# Patient Record
Sex: Female | Born: 1975 | Race: White | Hispanic: No | Marital: Married
Health system: Southern US, Community
[De-identification: ages and names within clinical notes are randomized; demographics above are authoritative.]

## PROBLEM LIST (undated history)

## (undated) HISTORY — PX: AUGMENTATION MAMMAPLASTY: SUR837

---

## 2017-10-27 DIAGNOSIS — J069 Acute upper respiratory infection, unspecified: Secondary | ICD-10-CM | POA: Diagnosis not present

## 2017-10-27 DIAGNOSIS — R05 Cough: Secondary | ICD-10-CM | POA: Diagnosis not present

## 2018-08-09 ENCOUNTER — Ambulatory Visit: Payer: Self-pay | Admitting: Family Medicine

## 2018-09-20 ENCOUNTER — Ambulatory Visit: Payer: Self-pay | Admitting: Family Medicine

## 2018-11-25 ENCOUNTER — Telehealth: Payer: Self-pay

## 2018-11-25 DIAGNOSIS — J029 Acute pharyngitis, unspecified: Secondary | ICD-10-CM | POA: Diagnosis not present

## 2018-11-25 NOTE — Telephone Encounter (Signed)
Called pt to inform of NP policy. Pt stated the first visit she cancelled due to the virus. Because that was when it all started.

## 2018-11-25 NOTE — Telephone Encounter (Signed)
Breanna Willis according to [employer], you can ask other [facility] providers if they would be willing to accept the patient as a new patient. Dr. Rogers Blocker is out until August so she is not available to ask immediately however, include her with the providers.   Until you hear back from the providers the patient may need to go to urgent care for their acute need as you need provider approval before rescheduling.   For clarification, the patient only no showed once and cancelled days before her the 1st appointment.

## 2018-11-25 NOTE — Telephone Encounter (Signed)
Hey, unsure what to do with pt. Pt cancelled twice for NP with Rogers Blocker. No availability here for acute. Will she need to establish at another office ?  Copied from [place] [phone]. Topic: Appointment Scheduling - Scheduling Inquiry for Clinic >> Nov 25, 2018  9:12 AM Alanda Slim E wrote: Reason for CRM: Pt needs to reschedule her New patient appt with Dr. Rogers Blocker and aslso wants to be seen because she has strep throat / please advise

## 2019-09-02 ENCOUNTER — Ambulatory Visit: Payer: BC Managed Care – PPO | Attending: Internal Medicine

## 2019-09-02 DIAGNOSIS — Z23 Encounter for immunization: Secondary | ICD-10-CM

## 2019-09-02 NOTE — Progress Notes (Signed)
   Covid-19 Vaccination Clinic  Name:  DENEEN SLAGER    MRN: [id number] DOB: 1976/04/06  09/02/2019  Ms. Maul was observed post Covid-19 immunization for 15 minutes without incident. She was provided with Vaccine Information Sheet and instruction to access the V-Safe system.   Ms. Full was instructed to call 911 with any severe reactions post vaccine: Marland Kitchen Difficulty breathing  . Swelling of face and throat  . A fast heartbeat  . A bad rash all over body  . Dizziness and weakness   Immunizations Administered    Name Date Dose VIS Date Route   Pfizer COVID-19 Vaccine 09/02/2019  8:16 AM 0.3 mL 05/06/2019 Intramuscular   Manufacturer: [employer], [employer]   Lot: [id number]   NDC: [address]-5

## 2019-10-03 ENCOUNTER — Ambulatory Visit: Payer: BC Managed Care – PPO | Attending: Internal Medicine

## 2019-10-03 DIAGNOSIS — Z0000 Encounter for general adult medical examination without abnormal findings: Secondary | ICD-10-CM | POA: Diagnosis not present

## 2019-10-03 DIAGNOSIS — Z13220 Encounter for screening for lipoid disorders: Secondary | ICD-10-CM | POA: Diagnosis not present

## 2019-10-03 DIAGNOSIS — Z23 Encounter for immunization: Secondary | ICD-10-CM

## 2019-10-03 NOTE — Progress Notes (Signed)
   Covid-19 Vaccination Clinic  Name:  ANEESHA HOLLORAN    MRN: [id number] DOB: 1975-11-13  10/03/2019  Ms. Kildow was observed post Covid-19 immunization for 15 minutes without incident. She was provided with Vaccine Information Sheet and instruction to access the V-Safe system.   Ms. Gagen was instructed to call 911 with any severe reactions post vaccine: Marland Kitchen Difficulty breathing  . Swelling of face and throat  . A fast heartbeat  . A bad rash all over body  . Dizziness and weakness   Immunizations Administered    Name Date Dose VIS Date Route   Pfizer COVID-19 Vaccine 10/03/2019  8:13 AM 0.3 mL 07/20/2018 Intramuscular   Manufacturer: [employer], [employer]   Lot: [id number]   NDC: [address]-3

## 2019-10-06 ENCOUNTER — Other Ambulatory Visit: Payer: Self-pay | Admitting: Family Medicine

## 2019-10-06 DIAGNOSIS — Z1231 Encounter for screening mammogram for malignant neoplasm of breast: Secondary | ICD-10-CM

## 2019-10-26 DIAGNOSIS — B349 Viral infection, unspecified: Secondary | ICD-10-CM | POA: Diagnosis not present

## 2019-10-26 DIAGNOSIS — H1013 Acute atopic conjunctivitis, bilateral: Secondary | ICD-10-CM | POA: Diagnosis not present

## 2020-06-25 DIAGNOSIS — U071 COVID-19: Secondary | ICD-10-CM | POA: Diagnosis not present

## 2020-06-25 DIAGNOSIS — Z20822 Contact with and (suspected) exposure to covid-19: Secondary | ICD-10-CM | POA: Diagnosis not present

## 2020-09-03 ENCOUNTER — Ambulatory Visit
Admission: RE | Admit: 2020-09-03 | Discharge: 2020-09-03 | Disposition: A | Discharge status: Home / Self Care | Payer: BC Managed Care – PPO | Source: Ambulatory Visit | Attending: Family Medicine | Admitting: Family Medicine

## 2020-09-03 ENCOUNTER — Other Ambulatory Visit: Payer: Self-pay

## 2020-09-03 DIAGNOSIS — Z1231 Encounter for screening mammogram for malignant neoplasm of breast: Secondary | ICD-10-CM

## 2020-09-05 ENCOUNTER — Other Ambulatory Visit: Payer: Self-pay | Admitting: Family Medicine

## 2020-09-05 DIAGNOSIS — R928 Other abnormal and inconclusive findings on diagnostic imaging of breast: Secondary | ICD-10-CM

## 2020-09-23 HISTORY — PX: BREAST BIOPSY: SHX20

## 2020-10-01 ENCOUNTER — Other Ambulatory Visit: Payer: Self-pay

## 2020-10-01 ENCOUNTER — Other Ambulatory Visit: Payer: Self-pay | Admitting: Family Medicine

## 2020-10-01 ENCOUNTER — Ambulatory Visit: Payer: BC Managed Care – PPO

## 2020-10-01 ENCOUNTER — Ambulatory Visit
Admission: RE | Admit: 2020-10-01 | Discharge: 2020-10-01 | Disposition: A | Discharge status: Home / Self Care | Payer: BC Managed Care – PPO | Source: Ambulatory Visit | Attending: Family Medicine | Admitting: Family Medicine

## 2020-10-01 DIAGNOSIS — R922 Inconclusive mammogram: Secondary | ICD-10-CM | POA: Diagnosis not present

## 2020-10-01 DIAGNOSIS — R928 Other abnormal and inconclusive findings on diagnostic imaging of breast: Secondary | ICD-10-CM | POA: Diagnosis not present

## 2020-10-01 DIAGNOSIS — R921 Mammographic calcification found on diagnostic imaging of breast: Secondary | ICD-10-CM

## 2020-10-01 DIAGNOSIS — Z803 Family history of malignant neoplasm of breast: Secondary | ICD-10-CM | POA: Diagnosis not present

## 2020-10-04 ENCOUNTER — Ambulatory Visit
Admission: RE | Admit: 2020-10-04 | Discharge: 2020-10-04 | Disposition: A | Discharge status: Home / Self Care | Payer: BC Managed Care – PPO | Source: Ambulatory Visit | Attending: Family Medicine | Admitting: Family Medicine

## 2020-10-04 ENCOUNTER — Other Ambulatory Visit: Payer: Self-pay

## 2020-10-04 DIAGNOSIS — R921 Mammographic calcification found on diagnostic imaging of breast: Secondary | ICD-10-CM

## 2020-10-04 DIAGNOSIS — N6011 Diffuse cystic mastopathy of right breast: Secondary | ICD-10-CM | POA: Diagnosis not present

## 2021-08-05 ENCOUNTER — Other Ambulatory Visit: Payer: Self-pay | Admitting: Family Medicine

## 2021-08-05 DIAGNOSIS — Z1231 Encounter for screening mammogram for malignant neoplasm of breast: Secondary | ICD-10-CM

## 2021-08-16 DIAGNOSIS — Z13220 Encounter for screening for lipoid disorders: Secondary | ICD-10-CM | POA: Diagnosis not present

## 2021-08-16 DIAGNOSIS — Z23 Encounter for immunization: Secondary | ICD-10-CM | POA: Diagnosis not present

## 2021-09-09 ENCOUNTER — Ambulatory Visit
Admission: RE | Admit: 2021-09-09 | Discharge: 2021-09-09 | Disposition: A | Discharge status: Home / Self Care | Payer: BC Managed Care – PPO | Source: Ambulatory Visit | Attending: Family Medicine | Admitting: Family Medicine

## 2021-09-09 DIAGNOSIS — Z1231 Encounter for screening mammogram for malignant neoplasm of breast: Secondary | ICD-10-CM

## 2021-09-18 IMAGING — MG MM BREAST LOCALIZATION CLIP
4 series · 4 of 12 positions shown · non-contrast
Comparison: Previous exam(s).

CLINICAL DATA: Post procedure mammogram for clip placement.

EXAM:
DIAGNOSTIC RIGHT MAMMOGRAM POST STEREOTACTIC BIOPSY

[R CC synth-2D]
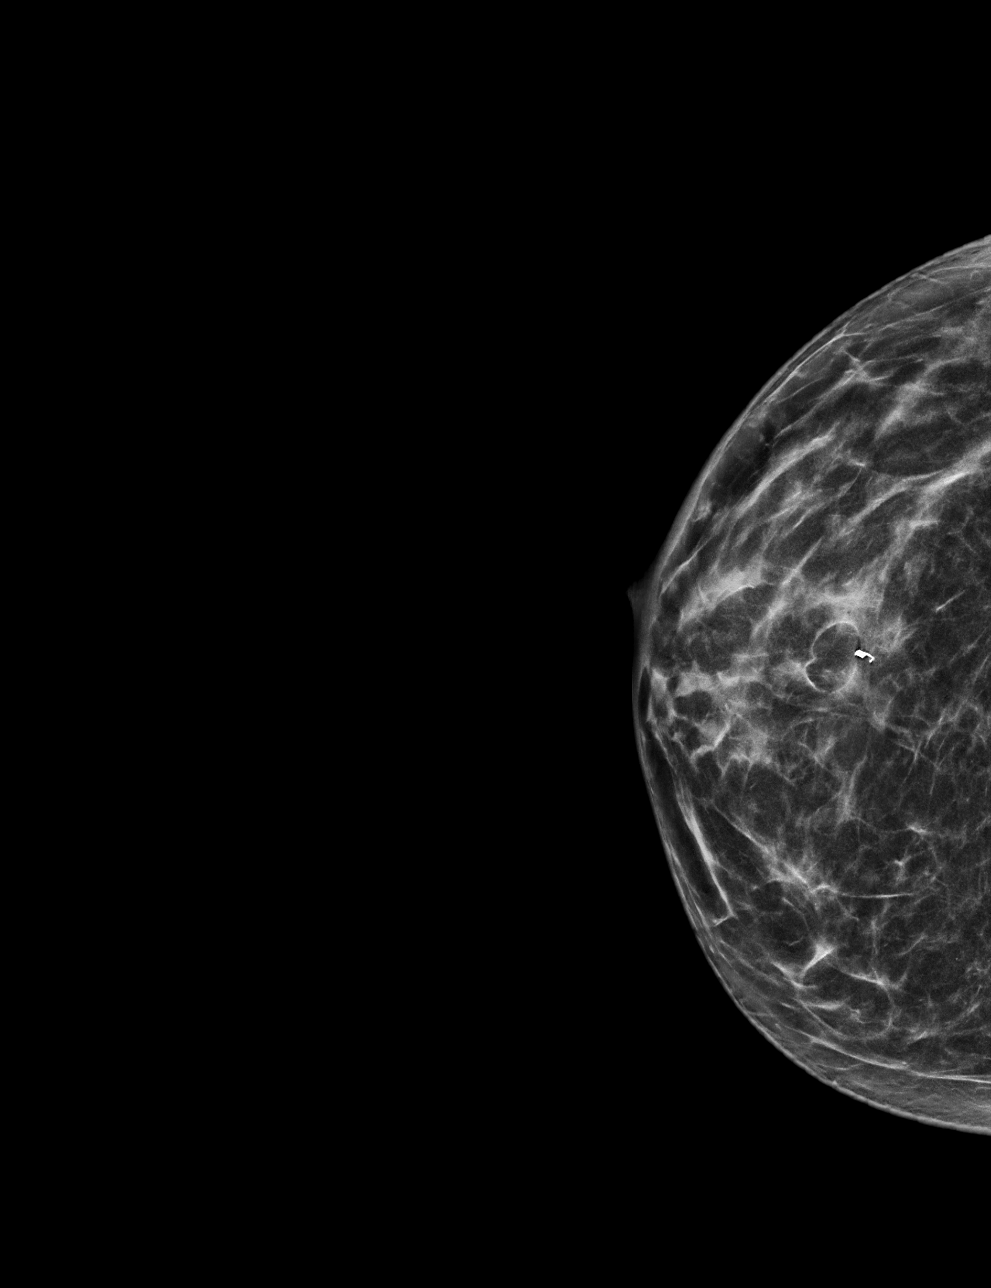

[R ML synth-2D]
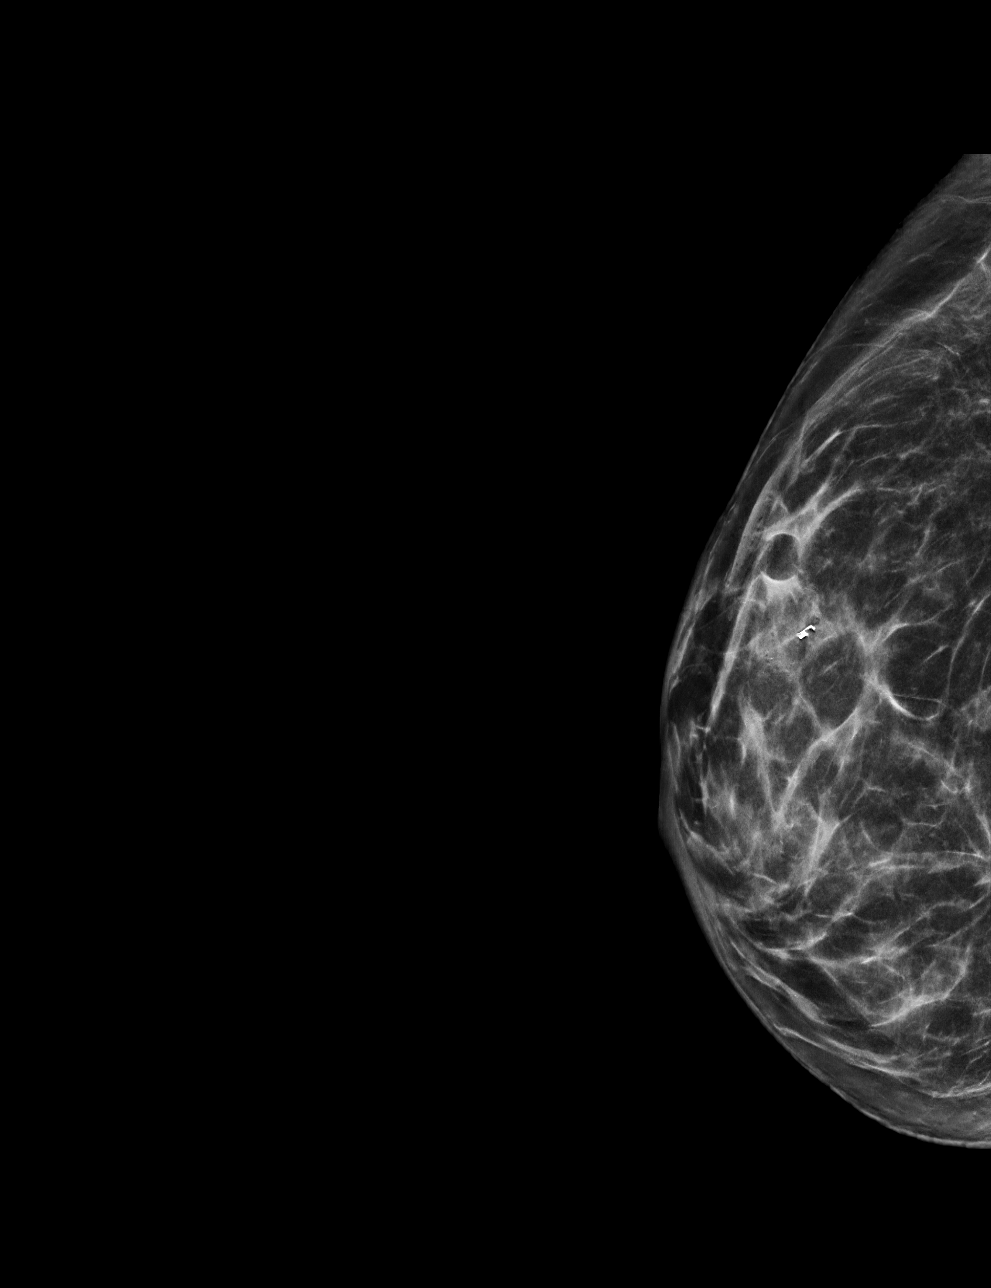

[R ML tomo · tomo slice 29/58.0]
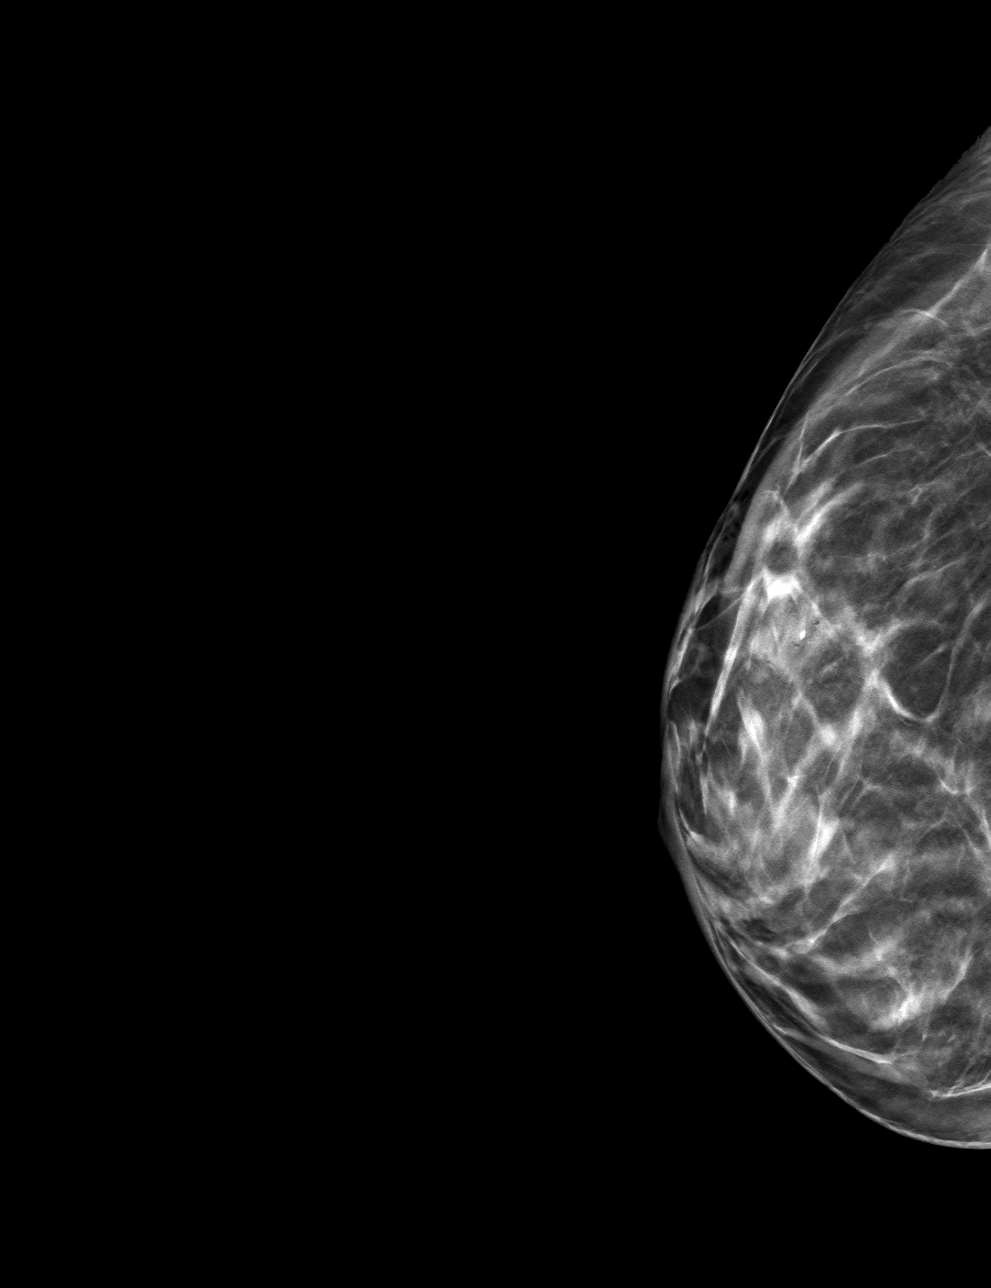

[R CC tomo · tomo slice 27/54.0]
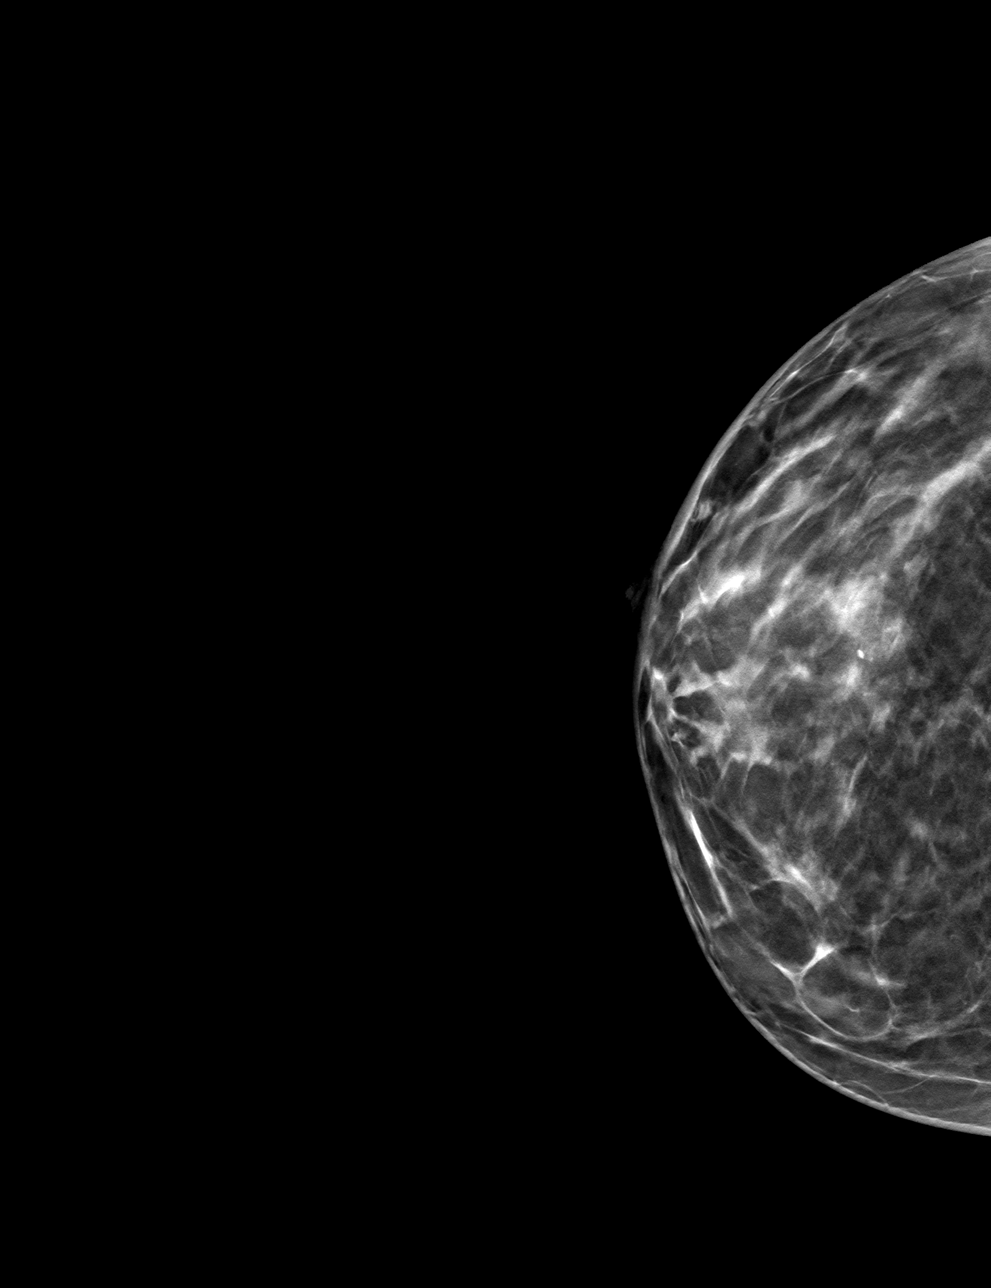

[4 of 12 positions shown; findings below may reference images not displayed]

FINDINGS: Mammographic images were obtained following stereotactic guided
biopsy of calcifications in the right breast at 12 o'clock. The coil
biopsy marking clip is in expected position at the site of biopsy.
IMPRESSION: Appropriate positioning of the coil shaped biopsy marking clip at
the site of biopsy in the right breast at 12 o'clock.

Final Assessment: Post Procedure Mammograms for Marker Placement

## 2021-09-18 IMAGING — MG MM BREAST BX W/ LOC DEV 1ST LESION IMAGE BX SPEC STEREO GUIDE*R*
8 of 11 series · 8 of 19 positions shown · non-contrast
Comparison: Previous exams.
COMPARISON: Previous exams.

Addendum:
CLINICAL DATA: 44-year-old female presenting for biopsy of right
breast calcifications.

EXAM:
RIGHT BREAST STEREOTACTIC CORE NEEDLE BIOPSY

[R (1 of 8)]
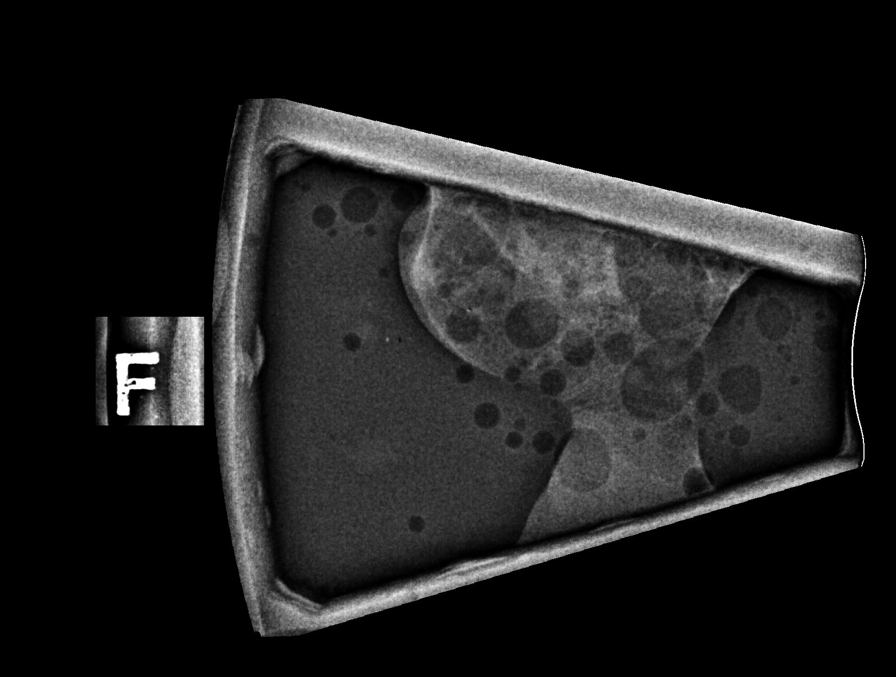

[R (2 of 8)]
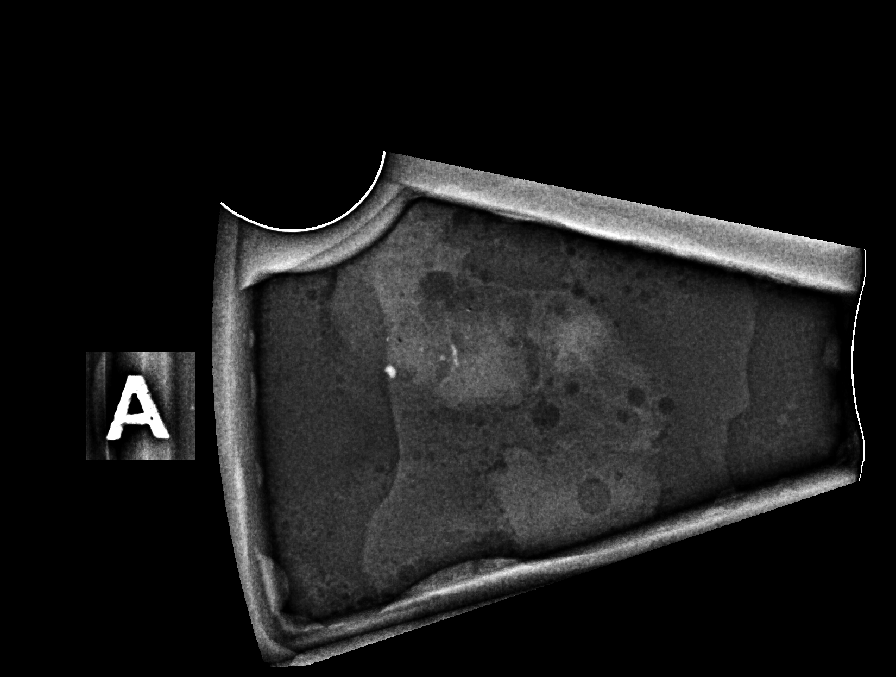

[R (3 of 8)]
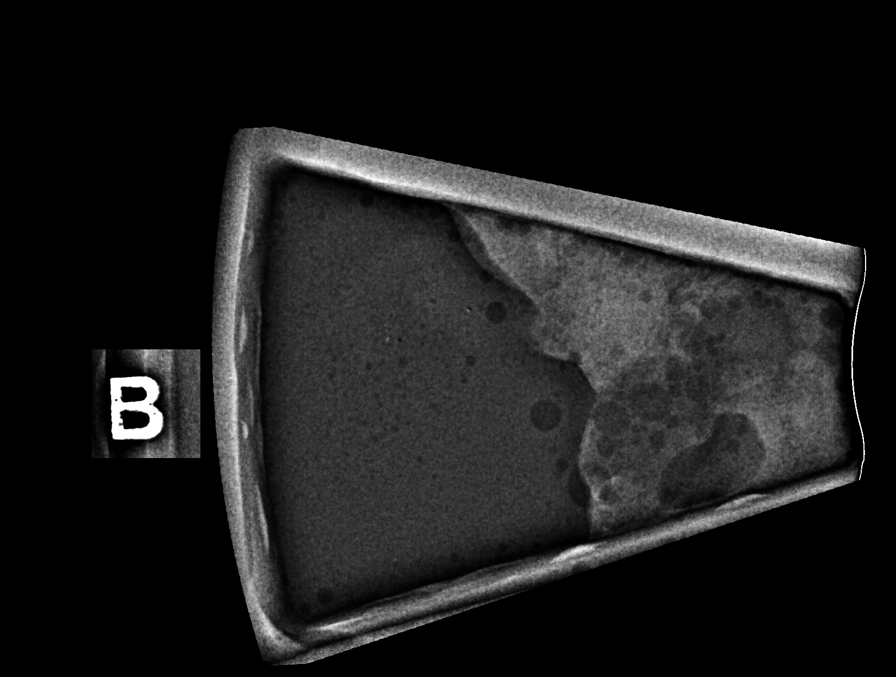

[R (4 of 8)]
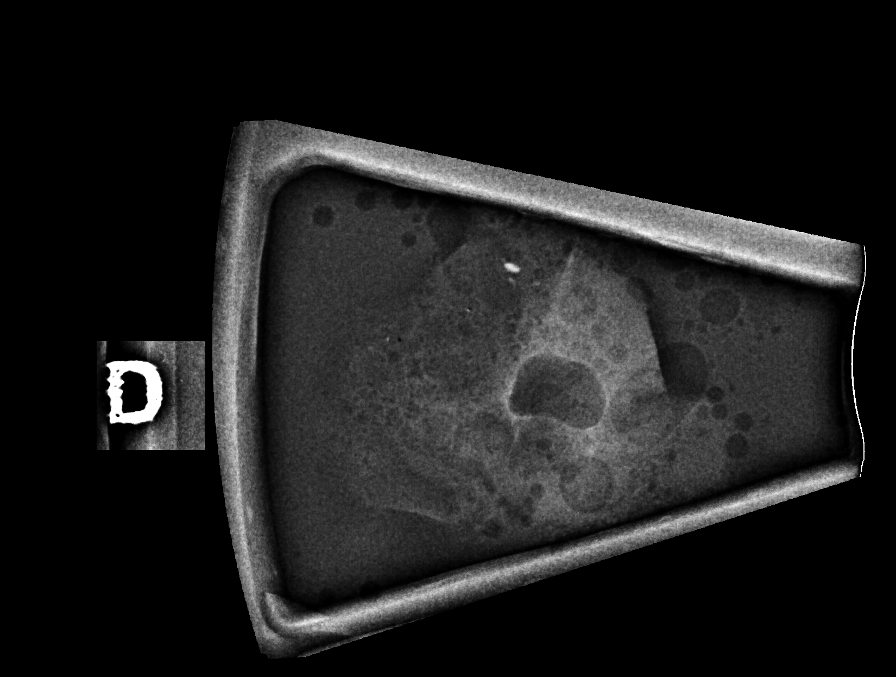

[R (5 of 8)]
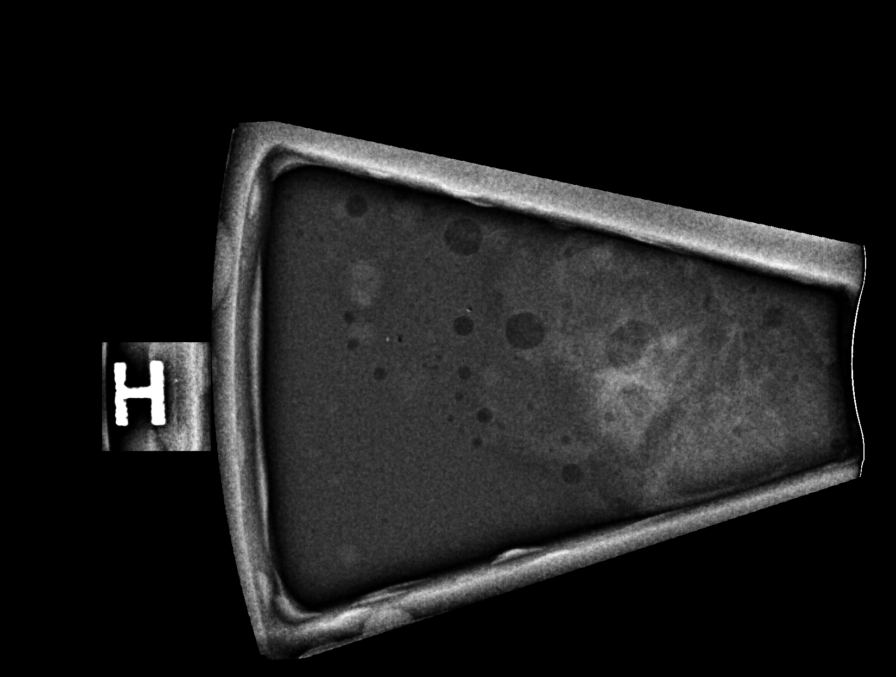

[R (6 of 8)]
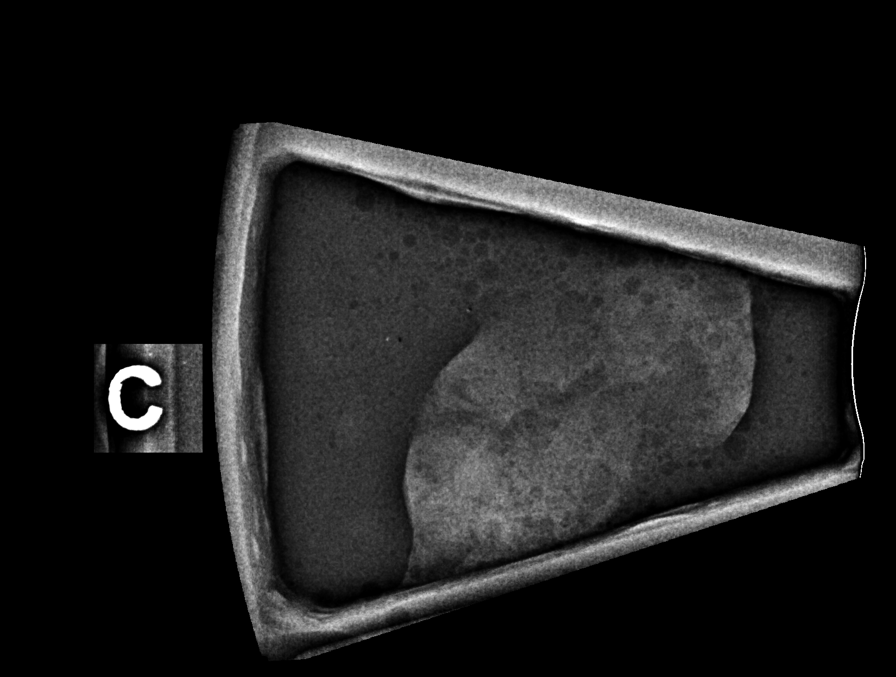

[R (7 of 8)]
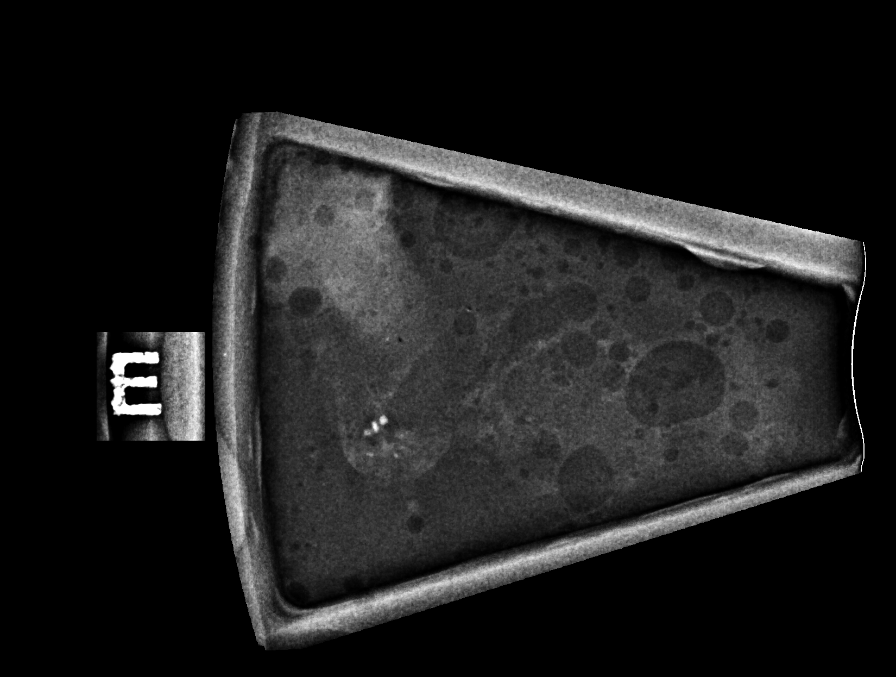

[R (8 of 8)]
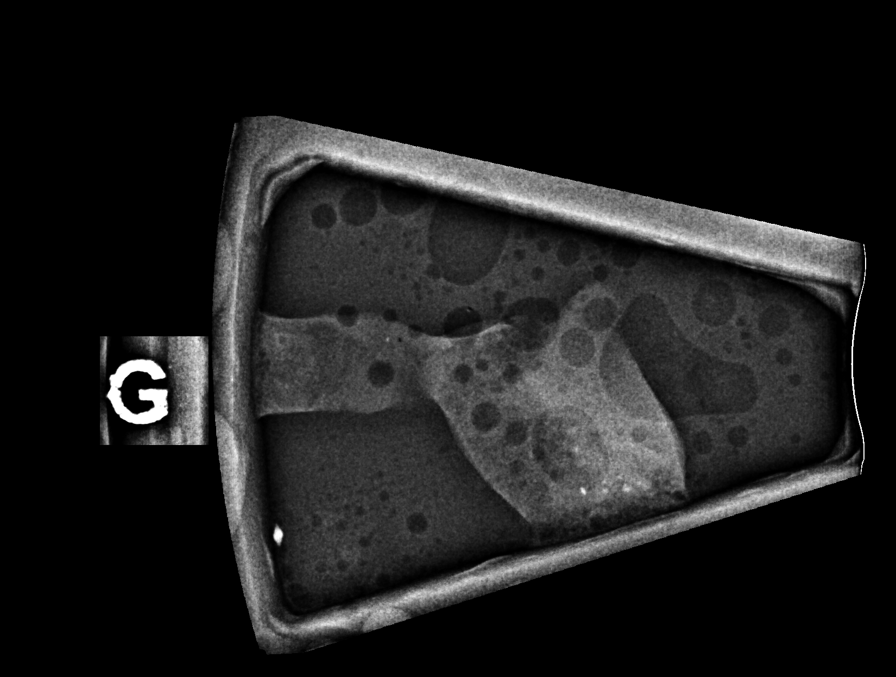

[8 of 19 positions shown; findings below may reference images not displayed]



Using sterile technique and 1% Lidocaine as local anesthetic, under
stereotactic guidance, a 9 gauge vacuum assisted device was used to
perform core needle biopsy of calcifications in the right breast at
12 o'clock using a superior approach. Specimen radiograph was
performed showing multiple specimens with calcifications. Specimens
with calcifications are identified for pathology.

Lesion quadrant: Upper inner quadrant

At the conclusion of the procedure, a coil tissue marker clip was
deployed into the biopsy cavity. Follow-up 2-view mammogram was
performed and dictated separately.
IMPRESSION: Stereotactic-guided biopsy of calcifications in the right breast at
12 o'clock. No apparent complications.

ADDENDUM:
Pathology revealed FIBROCYSTIC CHANGE TO INCLUDE PAPILLARY APOCRINE
METAPLASIA. USUAL DUCT EPITHELIAL HYPERPLASIA, MICROCALCIFICATIONS
ARE ASSOCIATED WITH THESE PROCESSES of the RIGHT breast, 12 o'clock.
This was found to be concordant by Dr. Thuy Janssen.

Pathology results were discussed with the patient by telephone. The
patient reported doing well after the biopsy with tenderness at the
site. Post biopsy instructions and care were reviewed and questions
were answered. The patient was encouraged to call The [REDACTED]

The patient was instructed to return for annual screening
mammography and informed a reminder notice would be sent regarding
this appointment.

Pathology results reported by Che Wah Billie RN on 10/05/2020.



Using sterile technique and 1% Lidocaine as local anesthetic, under
stereotactic guidance, a 9 gauge vacuum assisted device was used to
perform core needle biopsy of calcifications in the right breast at
12 o'clock using a superior approach. Specimen radiograph was
performed showing multiple specimens with calcifications. Specimens
with calcifications are identified for pathology.

Lesion quadrant: Upper inner quadrant

At the conclusion of the procedure, a coil tissue marker clip was
deployed into the biopsy cavity. Follow-up 2-view mammogram was
performed and dictated separately.
IMPRESSION: Stereotactic-guided biopsy of calcifications in the right breast at
12 o'clock. No apparent complications.

## 2021-10-29 ENCOUNTER — Other Ambulatory Visit (HOSPITAL_COMMUNITY)
Admission: RE | Admit: 2021-10-29 | Discharge: 2021-10-29 | Disposition: A | Discharge status: Home / Self Care | Payer: BC Managed Care – PPO | Source: Ambulatory Visit | Attending: Nurse Practitioner | Admitting: Nurse Practitioner

## 2021-10-29 DIAGNOSIS — Z124 Encounter for screening for malignant neoplasm of cervix: Secondary | ICD-10-CM | POA: Diagnosis not present

## 2021-10-29 DIAGNOSIS — Z01419 Encounter for gynecological examination (general) (routine) without abnormal findings: Secondary | ICD-10-CM | POA: Diagnosis not present

## 2021-10-31 LAB — CYTOLOGY - PAP
Comment: NEGATIVE
Diagnosis: NEGATIVE
Diagnosis: REACTIVE
High risk HPV: NEGATIVE

## 2022-01-16 DIAGNOSIS — Z205 Contact with and (suspected) exposure to viral hepatitis: Secondary | ICD-10-CM | POA: Diagnosis not present

## 2022-02-18 DIAGNOSIS — J019 Acute sinusitis, unspecified: Secondary | ICD-10-CM | POA: Diagnosis not present

## 2022-07-29 ENCOUNTER — Other Ambulatory Visit: Payer: Self-pay | Admitting: Family Medicine

## 2022-07-29 DIAGNOSIS — Z1231 Encounter for screening mammogram for malignant neoplasm of breast: Secondary | ICD-10-CM

## 2022-09-16 ENCOUNTER — Other Ambulatory Visit: Payer: Self-pay | Admitting: Family Medicine

## 2022-09-16 ENCOUNTER — Ambulatory Visit
Admission: RE | Admit: 2022-09-16 | Discharge: 2022-09-16 | Disposition: A | Discharge status: Home / Self Care | Payer: BC Managed Care – PPO | Source: Ambulatory Visit | Attending: Family Medicine | Admitting: Family Medicine

## 2022-09-16 DIAGNOSIS — Z1231 Encounter for screening mammogram for malignant neoplasm of breast: Secondary | ICD-10-CM

## 2022-09-24 DIAGNOSIS — Z13220 Encounter for screening for lipoid disorders: Secondary | ICD-10-CM | POA: Diagnosis not present

## 2022-09-24 DIAGNOSIS — Z0000 Encounter for general adult medical examination without abnormal findings: Secondary | ICD-10-CM | POA: Diagnosis not present

## 2022-10-28 DIAGNOSIS — R1013 Epigastric pain: Secondary | ICD-10-CM | POA: Diagnosis not present

## 2022-10-29 DIAGNOSIS — R1013 Epigastric pain: Secondary | ICD-10-CM | POA: Diagnosis not present

## 2022-11-04 DIAGNOSIS — Z01419 Encounter for gynecological examination (general) (routine) without abnormal findings: Secondary | ICD-10-CM | POA: Diagnosis not present

## 2022-12-03 ENCOUNTER — Other Ambulatory Visit: Payer: Self-pay | Admitting: Family Medicine

## 2022-12-03 DIAGNOSIS — L8990 Pressure ulcer of unspecified site, unspecified stage: Secondary | ICD-10-CM | POA: Diagnosis not present

## 2022-12-03 DIAGNOSIS — L989 Disorder of the skin and subcutaneous tissue, unspecified: Secondary | ICD-10-CM | POA: Diagnosis not present

## 2022-12-19 DIAGNOSIS — L089 Local infection of the skin and subcutaneous tissue, unspecified: Secondary | ICD-10-CM | POA: Diagnosis not present

## 2022-12-19 DIAGNOSIS — T8149XA Infection following a procedure, other surgical site, initial encounter: Secondary | ICD-10-CM | POA: Diagnosis not present

## 2023-02-18 DIAGNOSIS — D124 Benign neoplasm of descending colon: Secondary | ICD-10-CM | POA: Diagnosis not present

## 2023-02-18 DIAGNOSIS — D123 Benign neoplasm of transverse colon: Secondary | ICD-10-CM | POA: Diagnosis not present

## 2023-02-18 DIAGNOSIS — Z1211 Encounter for screening for malignant neoplasm of colon: Secondary | ICD-10-CM | POA: Diagnosis not present

## 2023-02-18 DIAGNOSIS — K648 Other hemorrhoids: Secondary | ICD-10-CM | POA: Diagnosis not present

## 2023-08-27 ENCOUNTER — Other Ambulatory Visit: Payer: Self-pay | Admitting: Family Medicine

## 2023-08-27 DIAGNOSIS — Z1231 Encounter for screening mammogram for malignant neoplasm of breast: Secondary | ICD-10-CM

## 2023-09-17 ENCOUNTER — Ambulatory Visit

## 2023-09-17 ENCOUNTER — Ambulatory Visit
Admission: RE | Admit: 2023-09-17 | Discharge: 2023-09-17 | Discharge status: Home / Self Care | Source: Ambulatory Visit | Attending: Family Medicine | Admitting: Family Medicine

## 2023-09-17 DIAGNOSIS — Z1231 Encounter for screening mammogram for malignant neoplasm of breast: Secondary | ICD-10-CM

## 2023-10-06 DIAGNOSIS — N951 Menopausal and female climacteric states: Secondary | ICD-10-CM | POA: Diagnosis not present

## 2023-10-06 DIAGNOSIS — Z0000 Encounter for general adult medical examination without abnormal findings: Secondary | ICD-10-CM | POA: Diagnosis not present

## 2023-10-06 DIAGNOSIS — R635 Abnormal weight gain: Secondary | ICD-10-CM | POA: Diagnosis not present

## 2023-11-16 DIAGNOSIS — Z01419 Encounter for gynecological examination (general) (routine) without abnormal findings: Secondary | ICD-10-CM | POA: Diagnosis not present

## 2023-11-16 DIAGNOSIS — N926 Irregular menstruation, unspecified: Secondary | ICD-10-CM | POA: Diagnosis not present

## 2023-11-16 DIAGNOSIS — N951 Menopausal and female climacteric states: Secondary | ICD-10-CM | POA: Diagnosis not present

## 2023-11-17 DIAGNOSIS — M9901 Segmental and somatic dysfunction of cervical region: Secondary | ICD-10-CM | POA: Diagnosis not present

## 2023-11-17 DIAGNOSIS — M9904 Segmental and somatic dysfunction of sacral region: Secondary | ICD-10-CM | POA: Diagnosis not present

## 2023-11-17 DIAGNOSIS — M9903 Segmental and somatic dysfunction of lumbar region: Secondary | ICD-10-CM | POA: Diagnosis not present

## 2023-11-17 DIAGNOSIS — M9906 Segmental and somatic dysfunction of lower extremity: Secondary | ICD-10-CM | POA: Diagnosis not present

## 2023-12-03 DIAGNOSIS — N926 Irregular menstruation, unspecified: Secondary | ICD-10-CM | POA: Diagnosis not present

## 2023-12-03 DIAGNOSIS — N951 Menopausal and female climacteric states: Secondary | ICD-10-CM | POA: Diagnosis not present
